# Patient Record
Sex: Male | Born: 2000 | Race: White | Hispanic: No | Marital: Single | Smoking: Never smoker
Health system: Southern US, Community
[De-identification: ages and names within clinical notes are randomized; demographics above are authoritative.]

---
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
## 2022-06-09 IMAGING — DX DG CHEST 1V PORT
1 series · 2 of 2 positions shown · non-contrast
Comparison: None.

CLINICAL DATA: Flank pain

EXAM:
PORTABLE CHEST 1 VIEW

[Series 1: chest · 0.14mm/px · 2 of 2 slices shown]
[im 1/2]
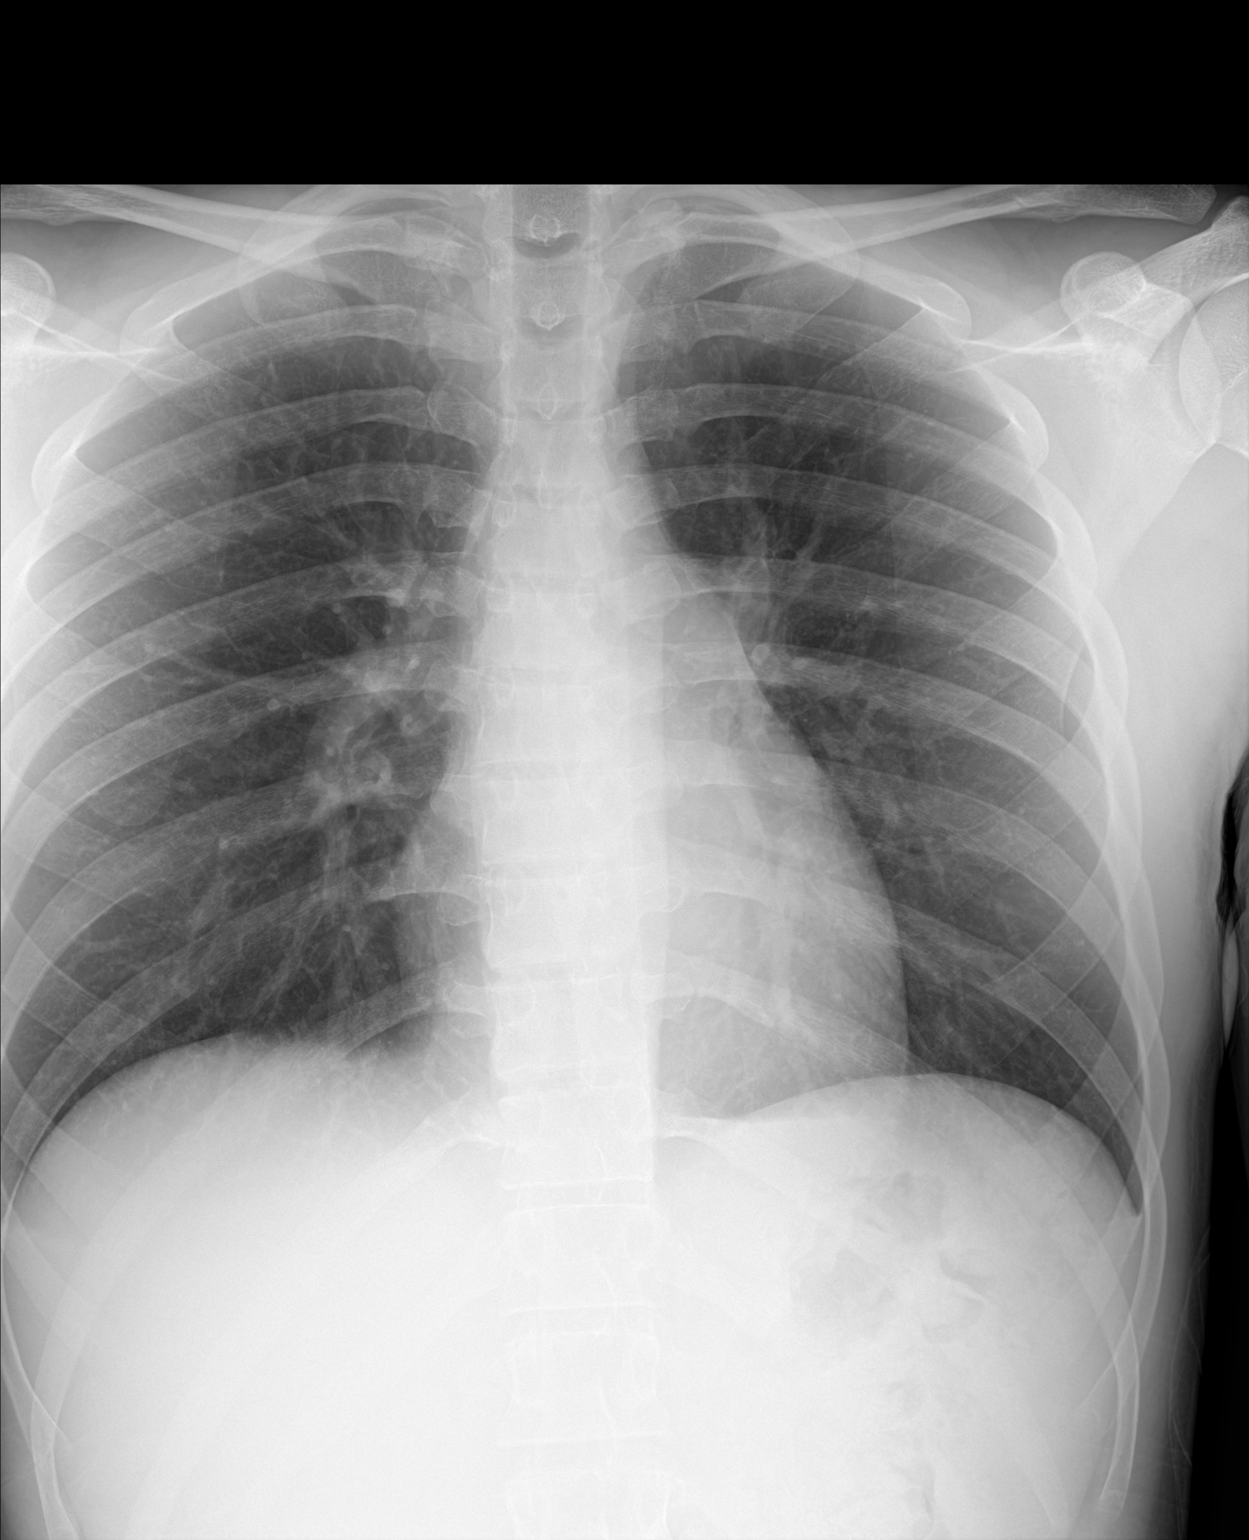
[im 2/2]
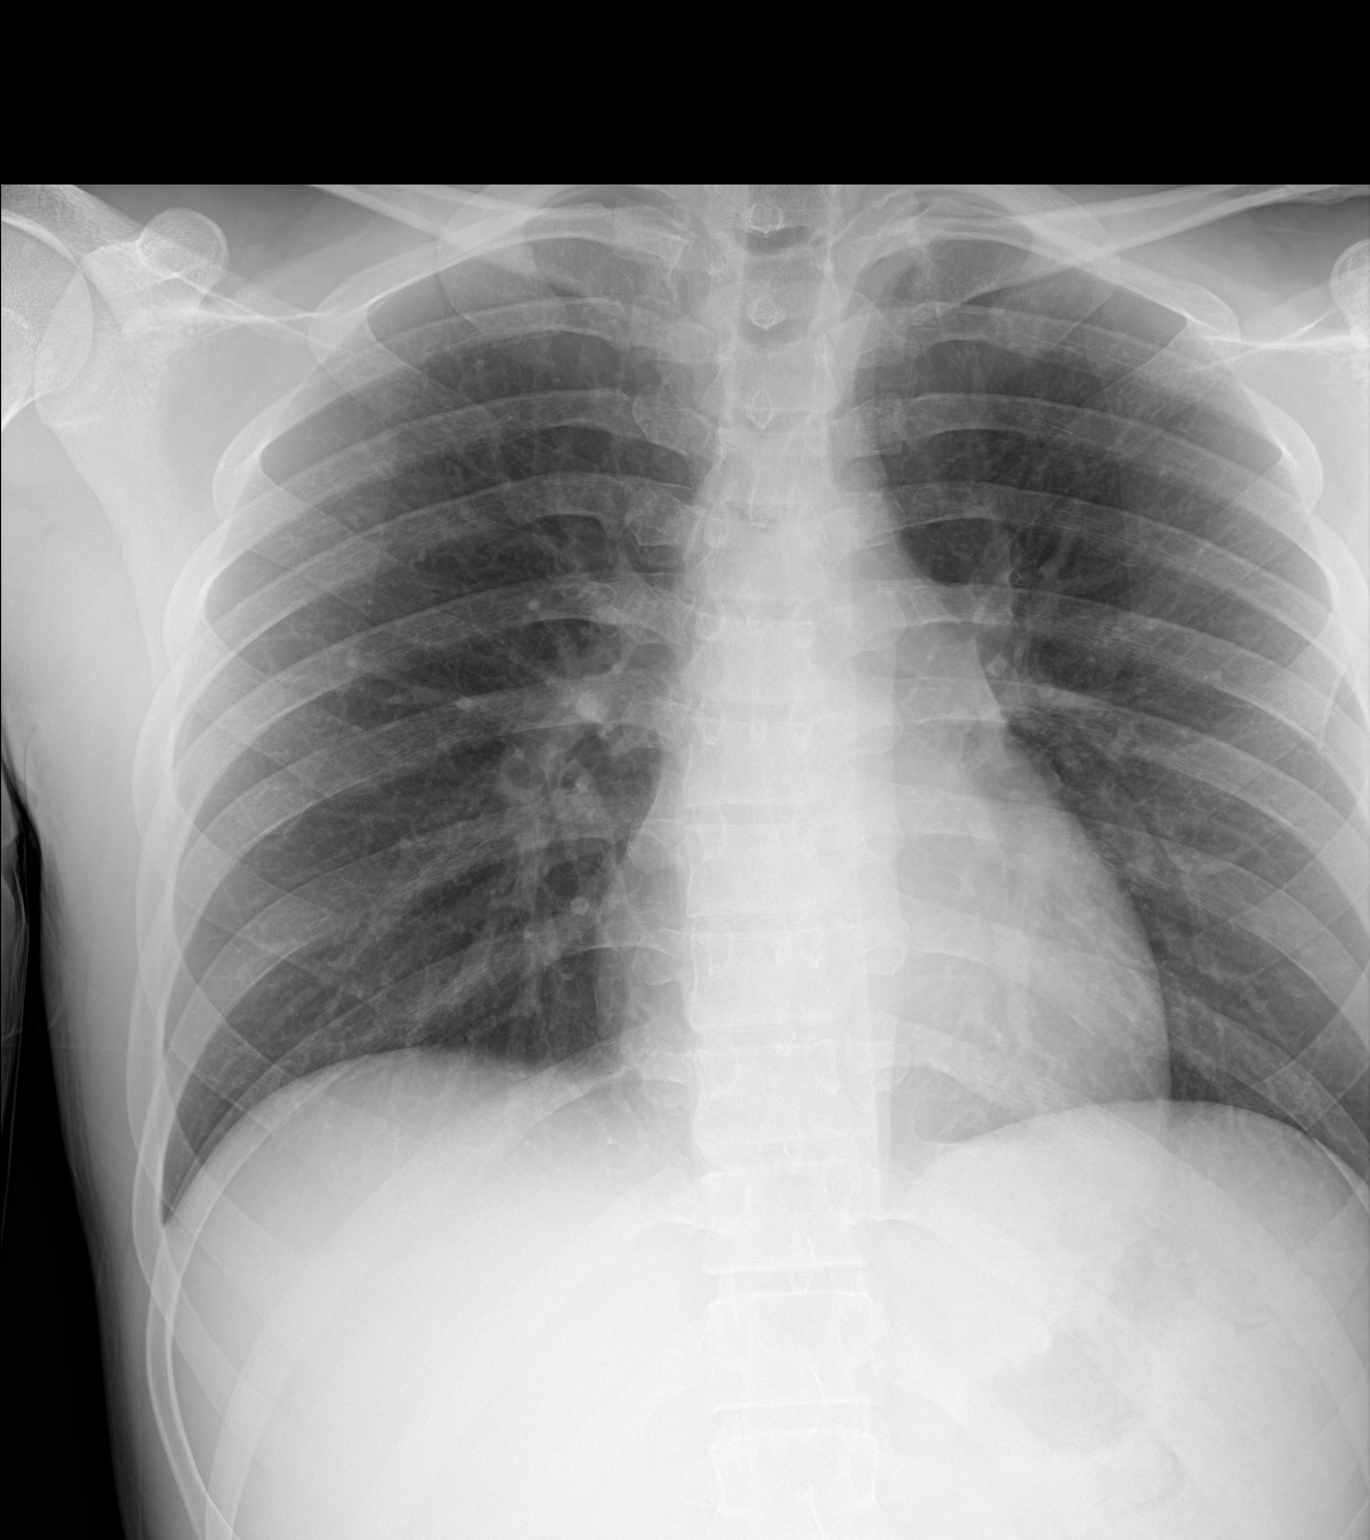

[2 of 2 positions shown; findings below may reference images not displayed]

FINDINGS: The cardiomediastinal silhouette is normal in contour. No pleural
effusion. No pneumothorax. No acute pleuroparenchymal abnormality.
Visualized abdomen is unremarkable. No acute osseous abnormality
noted.
IMPRESSION: No acute cardiopulmonary abnormality.
